# Patient Record
Sex: Male | Born: 1975 | Hispanic: Yes | Marital: Married | Smoking: Never smoker
Health system: Southern US, Community
[De-identification: ages and names within clinical notes are randomized; demographics above are authoritative.]

---

## 2012-02-05 ENCOUNTER — Telehealth: Payer: Self-pay | Admitting: Family Medicine

## 2012-02-05 NOTE — Telephone Encounter (Signed)
Pt's wife, Evan Chandler is your pt and they want you to be the pt's PCP as well.  The pt had some blood work completed through his employer and the results came back with some abnormalities (tryclycerides 892, plus others). [facility] ran the test and they strongly suggested he get in to see his dr immediately.  The first new pt apptmt you have available is not until March 17th. Can you accommodate him a new pt apptmt soon? Thank you.

## 2012-02-05 NOTE — Telephone Encounter (Signed)
Yes ok to accommodate sooner.

## 2012-02-06 ENCOUNTER — Ambulatory Visit (INDEPENDENT_AMBULATORY_CARE_PROVIDER_SITE_OTHER): Payer: 59 | Admitting: Family Medicine

## 2012-02-06 ENCOUNTER — Encounter: Payer: Self-pay | Admitting: Family Medicine

## 2012-02-06 VITALS — BP 122/82 | HR 71 | Temp 97.9°F | Ht 73.0 in | Wt 261.5 lb

## 2012-02-06 DIAGNOSIS — E782 Mixed hyperlipidemia: Secondary | ICD-10-CM

## 2012-02-06 DIAGNOSIS — R7989 Other specified abnormal findings of blood chemistry: Secondary | ICD-10-CM

## 2012-02-06 LAB — HEPATIC FUNCTION PANEL
Alkaline Phosphatase: 50 U/L (ref 39–117)
Bilirubin, Direct: 0.1 mg/dL (ref 0.0–0.3)
Total Bilirubin: 1 mg/dL (ref 0.3–1.2)

## 2012-02-06 LAB — LIPID PANEL
HDL: 30.8 mg/dL — ABNORMAL LOW (ref >39.00)
Total CHOL/HDL Ratio: 6
VLDL: 48.8 mg/dL — ABNORMAL HIGH (ref 0.0–40.0)

## 2012-02-06 LAB — GAMMA GT: GGT: 18 U/L (ref 7–51)

## 2012-02-06 NOTE — Progress Notes (Signed)
  Subjective:    Patient ID: Evan Chandler, male    DOB: Jul 09, 1975, 37 y.o.   MRN: [id number]  HPI  37 yo previously healthy male here to establish care emergently with his wife (she is my patient) to discuss abnormal labs.  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  Brings his labs in with him today:  AST 246 ALT 101 TG 892  All other labs within normal limits- CBC, BMET.  Per pt, he had a h/o elevated TG in 400s 5 or 6 years ago but last year his TG were 135.  He has never had abnormal liver function tests.  Denies any abdominal pain, nausea or vomiting. No changes in his bowel habits.  Repots drinking a few beers a few times per month. Takes Excedrin migraine maybe twice a month- no other NSAIDs or Acetaminophen containing substance.  Upon further questioning, he does admit to taking supplements including- Branched chained amino acids, C4 , "Jacked 3D."  But he does not admit to using steroids.  Patient Active Problem List  Diagnosis  . Elevated cholesterol with high triglycerides  . Elevated liver function tests   No past medical history on file. No past surgical history on file. History  Substance Use Topics  . Smoking status: Never Smoker   . Smokeless tobacco: Not on file  . Alcohol Use: Yes   Family History  Problem Relation Age of Onset  . Diabetes Mother   . Diabetes Father    No Known Allergies No current outpatient prescriptions on file prior to visit.   The PMH, PSH, Social History, Family History, Medications, and allergies have been reviewed in [facility], and have been updated if relevant.   Review of Systems See HPI Patient reports no  vision/ hearing changes,anorexia, weight change, fever ,adenopathy, persistant / recurrent hoarseness, swallowing issues, chest pain, edema,persistant / recurrent cough, hemoptysis, dyspnea(rest, exertional, paroxysmal nocturnal), gastrointestinal  bleeding (melena, rectal bleeding),  abdominal pain, excessive heart burn, GU symptoms(dysuria, hematuria, pyuria, voiding/incontinence  Issues) syncope, focal weakness, severe memory loss, concerning skin lesions, depression, anxiety, abnormal bruising/bleeding, major joint swelling.       Objective:   Physical Exam BP 122/82  Pulse 71  Temp 97.9 F (36.6 C) (Oral)  Ht 6\' 1"  (1.854 m)  Wt 261 lb 8 oz (118.616 kg)  BMI 34.50 kg/m2  SpO2 97% Gen:  Very muscular male, NAD Resp:  CTA bilaterally No gynceomastia CVS:  RRR Abd:  Sofr, NT, no hepatosplenomegaly Ext:  No edema    Assessment & Plan:   1. Elevated cholesterol with high triglycerides  New- Will absolutely need to start triglyceride reducing medication like Tricor but I would like to confirm that this was not lab error.  Also discussed ways to lower triglycerides- see pt instructions for details.  I suspect his supplements are playing a roll as well. Lipid Panel  2. Elevated liver function tests  New- I suspect this is due to his supplement use, most of which appear to be metabolized in liver.  I am also concerned about possibility of steroid use.  Will check hepatic function panel, GTT and call pt tomorrow with labs results. The patient indicates understanding of these issues and agrees with the plan.  Gamma GT, Hepatic function panel

## 2012-02-06 NOTE — Patient Instructions (Addendum)
Nice to meet you.  Let's recheck your labs today.  Most likely we will need to start a medication to lower your triglycerides (if these labs were true). Please STOP taking all supplements, alcohol and excedrin for now.  Weight loss (even small amount) can decrease triglycerides.  Decrease added sugars, eliminate trans fats, increase fiber and limit alcohol.  All these changes together can drop triglycerides by almost 50%.  We will call you tomorrow.

## 2012-02-07 ENCOUNTER — Telehealth: Payer: Self-pay | Admitting: Family Medicine

## 2012-02-07 NOTE — Telephone Encounter (Signed)
Patient has been advised

## 2012-02-07 NOTE — Telephone Encounter (Signed)
Evan Chandler, Please call the patient with his most recent lab results.  He is anxious to know the results.  We had the wrong number in Epic but I have corrected it in his demographics and listed the correct phone # below.   [phone]

## 2012-04-06 ENCOUNTER — Ambulatory Visit: Payer: Self-pay | Admitting: Family Medicine

## 2013-10-05 ENCOUNTER — Ambulatory Visit (INDEPENDENT_AMBULATORY_CARE_PROVIDER_SITE_OTHER): Payer: 59 | Admitting: Family Medicine

## 2013-10-05 ENCOUNTER — Encounter: Payer: Self-pay | Admitting: Family Medicine

## 2013-10-05 VITALS — BP 126/70 | HR 75 | Temp 98.3°F | Wt 263.5 lb

## 2013-10-05 DIAGNOSIS — R109 Unspecified abdominal pain: Secondary | ICD-10-CM

## 2013-10-05 DIAGNOSIS — R1032 Left lower quadrant pain: Secondary | ICD-10-CM | POA: Insufficient documentation

## 2013-10-05 LAB — POCT URINALYSIS DIPSTICK
Bilirubin, UA: NEGATIVE
Glucose, UA: NEGATIVE
Ketones, UA: NEGATIVE
Leukocytes, UA: NEGATIVE
NITRITE UA: NEGATIVE
PH UA: 6
PROTEIN UA: NEGATIVE
RBC UA: NEGATIVE
Spec Grav, UA: >=1.03
UROBILINOGEN UA: 0.2

## 2013-10-05 NOTE — Progress Notes (Signed)
   Subjective:   Patient ID: Evan Chandler, male    DOB: Feb 22, 1975, 38 y.o.   MRN: [id number]  Evan Chandler is a pleasant 38 y.o. year old male who presents to clinic today with Groin Pain  on 10/05/2013  HPI:  Left inguinal pain- Over a year of left inguinal pain (last seconds) when he coughs.  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  No pain with bowel movements. No nausea, vomiting or fevers.  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  Has not felt any testicular or scrotal masses.  Remote h/o bilateral inguinal hernia repair as an infant.  No current outpatient prescriptions on file prior to visit.   No current facility-administered medications on file prior to visit.    No Known Allergies  No past medical history on file.  No past surgical history on file.  Family History  Problem Relation Age of Onset  . Diabetes Mother   . Diabetes Father     History   Social History  . Marital Status: Married    Spouse Name: N/A    Number of Children: N/A  . Years of Education: N/A   Occupational History  . firefighter Unemployed   Social History Main Topics  . Smoking status: Never Smoker   . Smokeless tobacco: Not on file  . Alcohol Use: Yes  . Drug Use: No  . Sexual Activity: Not on file   Other Topics Concern  . Not on file   Social History Narrative  . No narrative on file   The PMH, PSH, Social History, Family History, Medications, and allergies have been reviewed in [facility], and have been updated if relevant.  Review of Systems See HPI No dysuria No difficulty urinating No sexual difficulty    Objective:    BP 126/70  Pulse 75  Temp(Src) 98.3 F (36.8 C) (Oral)  Wt 263 lb 8 oz (119.523 kg)  SpO2 98%   Physical Exam  Nursing note and vitals reviewed. Constitutional: He appears well-developed and well-nourished. No distress.  Abdominal: A hernia is present. Hernia  confirmed positive in the left inguinal area.  Genitourinary: Cremasteric reflex is present.  Lymphadenopathy:       Left: No inguinal adenopathy present.  Psychiatric: He has a normal mood and affect. His speech is normal and behavior is normal. Judgment and thought content normal. Cognition and memory are normal.          Assessment & Plan:   Left inguinal pain - Plan: US Scrotum  Flank pain - Plan: Urinalysis Dipstick No Follow-up on file.

## 2013-10-05 NOTE — Addendum Note (Signed)
Addended by: Dianne Dun on: 10/05/2013 12:08 PM   Modules accepted: Orders

## 2013-10-05 NOTE — Patient Instructions (Signed)
Good to see you. Please stop by to see Evan Chandler on your way out to set up your ultrasound. We will call you with these results as soon as we have them.

## 2013-10-05 NOTE — Assessment & Plan Note (Signed)
New- persistent. Consistent with inguinal hernia. Will get scrotal/inguinal [place] today. Likely will need referral to surgeon. The patient indicates understanding of these issues and agrees with the plan.

## 2013-10-05 NOTE — Progress Notes (Signed)
Pre visit review using our clinic review tool, if applicable. No additional management support is needed unless otherwise documented below in the visit note. 

## 2013-10-06 ENCOUNTER — Ambulatory Visit
Admission: RE | Admit: 2013-10-06 | Discharge: 2013-10-06 | Disposition: A | Discharge status: Home / Self Care | Payer: 59 | Source: Ambulatory Visit | Attending: Family Medicine | Admitting: Family Medicine

## 2013-10-06 ENCOUNTER — Telehealth: Payer: Self-pay

## 2013-10-06 DIAGNOSIS — R1032 Left lower quadrant pain: Secondary | ICD-10-CM

## 2013-10-06 NOTE — Telephone Encounter (Signed)
Pt left v/m requesting cb when [place] results available.

## 2013-10-07 ENCOUNTER — Other Ambulatory Visit: Payer: Self-pay | Admitting: Family Medicine

## 2013-10-07 DIAGNOSIS — R1032 Left lower quadrant pain: Secondary | ICD-10-CM

## 2013-10-07 NOTE — Telephone Encounter (Signed)
See phone note

## 2013-10-08 ENCOUNTER — Other Ambulatory Visit: Payer: Self-pay | Admitting: Family Medicine

## 2013-10-21 ENCOUNTER — Telehealth: Payer: Self-pay

## 2013-10-21 NOTE — Telephone Encounter (Signed)
Pt left v/m; [facility] but no message was left and pt request cb with reason for call.

## 2013-10-22 NOTE — Telephone Encounter (Signed)
Shirlee LimerickMarion tried to contact pt and will call pt back.

## 2013-10-22 NOTE — Telephone Encounter (Signed)
i did not contact this pt

## 2013-12-29 ENCOUNTER — Ambulatory Visit (INDEPENDENT_AMBULATORY_CARE_PROVIDER_SITE_OTHER): Payer: 59 | Admitting: Family Medicine

## 2013-12-29 ENCOUNTER — Encounter: Payer: Self-pay | Admitting: Family Medicine

## 2013-12-29 VITALS — BP 120/84 | HR 71 | Temp 98.3°F | Wt 252.0 lb

## 2013-12-29 DIAGNOSIS — R1032 Left lower quadrant pain: Secondary | ICD-10-CM

## 2013-12-29 DIAGNOSIS — R1030 Lower abdominal pain, unspecified: Secondary | ICD-10-CM

## 2013-12-29 DIAGNOSIS — E782 Mixed hyperlipidemia: Secondary | ICD-10-CM

## 2013-12-29 LAB — COMPREHENSIVE METABOLIC PANEL
ALK PHOS: 49 U/L (ref 39–117)
ALT: 28 U/L (ref 0–53)
AST: 25 U/L (ref 0–37)
Albumin: 4.6 g/dL (ref 3.5–5.2)
BILIRUBIN TOTAL: 1.3 mg/dL — AB (ref 0.2–1.2)
BUN: 19 mg/dL (ref 6–23)
CO2: 25 mEq/L (ref 19–32)
Calcium: 9.1 mg/dL (ref 8.4–10.5)
Chloride: 101 mEq/L (ref 96–112)
Creatinine, Ser: 1 mg/dL (ref 0.4–1.5)
GFR: 92.46 mL/min (ref >60.00)
GLUCOSE: 87 mg/dL (ref 70–99)
Potassium: 4.4 mEq/L (ref 3.5–5.1)
SODIUM: 135 meq/L (ref 135–145)
Total Protein: 7.4 g/dL (ref 6.0–8.3)

## 2013-12-29 LAB — LIPID PANEL
Cholesterol: 183 mg/dL (ref 0–200)
HDL: 36.7 mg/dL — ABNORMAL LOW (ref >39.00)
LDL CALC: 122 mg/dL — AB (ref 0–99)
NONHDL: 146.3
Total CHOL/HDL Ratio: 5
Triglycerides: 122 mg/dL (ref 0.0–149.0)
VLDL: 24.4 mg/dL (ref 0.0–40.0)

## 2013-12-29 LAB — PSA: PSA: 0.33 ng/mL (ref 0.10–4.00)

## 2013-12-29 NOTE — Assessment & Plan Note (Signed)
Overdue for labs. Will check labs today Also discussed continuing to work on his diet which he has been doing- has lost 11 pounds since Sept 2015!

## 2013-12-29 NOTE — Patient Instructions (Signed)
Great to see you and happy holidays. We will call you with your lab results and your urology appointment.

## 2013-12-29 NOTE — Progress Notes (Signed)
Subjective:   Patient ID: Evan Chandler, male    DOB: Mar 07, 1975, 38 y.o.   MRN: [id number]  Evan Girtdrian Mcqueen is a pleasant 38 y.o. year old male who presents to clinic today with Follow-up and Groin Pain  on 12/29/2013  HPI:  Left inguinal pain- initially saw him for this in 09/2013.     Over a year of left inguinal pain (last seconds) when he coughs.  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  No pain with bowel movements. No nausea, vomiting or fevers.  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  Has not felt any testicular or scrotal masses.  Remote h/o bilateral inguinal hernia repair as an infant.  US of left inguinal region neg.  He is a IT sales professionalfirefighter and this pain has not changed but he is concerned that it continues to occur intermittently. Also concerned about possible cancer from exposure to work related carcinogens.  H/p hypertriglyceridemia- overdue for labs.  He is still taking omega 3 Lab Results  Component Value Date   CHOL 198 02/06/2012   HDL 30.80* 02/06/2012   LDLDIRECT 100.2 02/06/2012   TRIG 244.0* 02/06/2012   CHOLHDL 6 02/06/2012    No current outpatient prescriptions on file prior to visit.   No current facility-administered medications on file prior to visit.    No Known Allergies  No past medical history on file.  No past surgical history on file.  Family History  Problem Relation Age of Onset  . Diabetes Mother   . Diabetes Father     History   Social History  . Marital Status: Married    Spouse Name: N/A    Number of Children: N/A  . Years of Education: N/A   Occupational History  . firefighter Unemployed   Social History Main Topics  . Smoking status: Never Smoker   . Smokeless tobacco: Not on file  . Alcohol Use: 0.0 oz/week    0 Not specified per week     Comment: occasional  . Drug Use: No  . Sexual Activity: Not on file   Other Topics Concern    . Not on file   Social History Narrative   The PMH, PSH, Social History, Family History, Medications, and allergies [facility], and have been updated if relevant.  Review of Systems See HPI No dysuria No difficulty urinating No sexual difficulty No nausea or vomiting No back pain No LE edema No radiculopathy No abdominal pain    Objective:    BP 120/84 mmHg  Pulse 71  Temp(Src) 98.3 F (36.8 C) (Oral)  Wt 252 lb (114.306 kg)  SpO2 98%  Wt Readings from Last 3 Encounters:  12/29/13 252 lb (114.306 kg)  10/05/13 263 lb 8 oz (119.523 kg)  02/06/12 261 lb 8 oz (118.616 kg)     Physical Exam  Constitutional: He is oriented to person, place, and time. He appears well-developed and well-nourished. No distress.  HENT:  Head: Normocephalic.  Cardiovascular: Normal rate.   Pulmonary/Chest: Effort normal.  Abdominal: Soft. Bowel sounds are normal. He exhibits no distension and no mass. There is no tenderness. There is no rebound and no guarding. Hernia confirmed negative in the right inguinal area and confirmed negative in the left inguinal area.  Genitourinary: Testes normal and penis normal. Cremasteric reflex is present. Circumcised.  Musculoskeletal: He exhibits no edema.  Lymphadenopathy:  Right: No inguinal adenopathy present.       Left: No inguinal adenopathy present.  Neurological: He is alert and oriented to person, place, and time.  Skin: Skin is warm and dry.  Psychiatric: He has a normal mood and affect. His behavior is normal.          Assessment & Plan:   Elevated cholesterol with high triglycerides - Plan: Lipid panel, Comprehensive metabolic panel  Left groin pain - Plan: Ambulatory referral to Urology, PSA No Follow-up on file.

## 2013-12-29 NOTE — Progress Notes (Signed)
Pre visit review using our clinic review tool, if applicable. No additional management support is needed unless otherwise documented below in the visit note. 

## 2013-12-29 NOTE — Assessment & Plan Note (Signed)
Persistent. Agrees now to urology referral. Exam reassuring again- ?overuse injury like sports hernia but evaluation from urology would be prudent given duration of symptoms. The patient indicates understanding of these issues and agrees with the plan. Will also check labs given his work exposure to carcinogens.   ?scrotal ultrasound as well- will refer to urology.

## 2013-12-30 ENCOUNTER — Encounter: Payer: Self-pay | Admitting: *Deleted

## 2014-01-17 ENCOUNTER — Other Ambulatory Visit: Payer: Self-pay | Admitting: Occupational Medicine

## 2014-01-17 ENCOUNTER — Ambulatory Visit
Admission: RE | Admit: 2014-01-17 | Discharge: 2014-01-17 | Disposition: A | Discharge status: Home / Self Care | Payer: No Typology Code available for payment source | Source: Ambulatory Visit | Attending: Occupational Medicine | Admitting: Occupational Medicine

## 2014-01-17 DIAGNOSIS — Z0000 Encounter for general adult medical examination without abnormal findings: Secondary | ICD-10-CM

## 2014-01-28 ENCOUNTER — Ambulatory Visit (INDEPENDENT_AMBULATORY_CARE_PROVIDER_SITE_OTHER): Payer: Self-pay | Admitting: Surgery

## 2014-01-28 NOTE — H&P (Signed)
Evan Chandler 01/28/2014 9:20 AM Location: [facility] Patient #: [id number] DOB: December 25, 1975 Married / Language: [place] / Race: White Male History of Present Illness Evan Chandler(Evan Augspurger A. Nazaire Cordial MD; 01/28/2014 10:45 AM) [facility] words: [facility]   pt sent at the request of Dr Brunilda PayorNesi for left inguinal hernia. He has had a bulge in his left groin for 5 years he thinks. It causes discomfort if he stands for more than 30 minutes. 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 He strains alot and this does not cause him any groin pain. The discomfort with standing is minimal. No bulge on the right side. The discomfort is described as burning. It is made better when he sits.  The patient is a 39 year old male   Other Problems Evan Chandler(Evan Chandler, CMA; 01/28/2014 9:20 AM) Inguinal Hernia  Diagnostic Studies History Evan Chandler(Evan Chandler, CMA; 01/28/2014 9:20 AM) Colonoscopy never  Allergies (Evan Chandler, CMA; 01/28/2014 9:22 AM) No Known Drug Allergies 01/28/2014  Medication History (Evan Chandler, CMA; 01/28/2014 9:22 AM) No Current Medications  Social History Evan Chandler(Evan Chandler, CMA; 01/28/2014 9:20 AM) Caffeine use Carbonated beverages.  Family History Evan Chandler(Evan Chandler, CMA; 01/28/2014 9:20 AM) Arthritis Mother.     Review of Systems Evan Chandler(Evan Chandler CMA; 01/28/2014 9:20 AM) General Not Present- Appetite Loss, Chills, Fatigue, Fever, Night Sweats, Weight Gain and Weight Loss. Skin Not Present- Change in Wart/Mole, Dryness, Hives, Jaundice, New Lesions, Non-Healing Wounds, Rash and Ulcer. Male Genitourinary Not Present- Blood in Urine, Change in Urinary Stream, Frequency, Impotence, Nocturia, Painful Urination, Urgency and Urine Leakage.  Vitals (Evan Chandler CMA; 01/28/2014 9:22 AM) 01/28/2014 9:20 AM Weight: 258 lb Height: 73in Body Surface Area: 2.46 m Body Mass Index: 34.04 kg/m Temp.: 98.56F(Temporal)  Pulse: 70 (Regular)  Resp.: 18 (Unlabored)  BP: 130/76 (Sitting, Left Arm,  Standard)     Physical Exam (Evan Demarinis A. Jaeliana Lococo MD; 01/28/2014 10:46 AM)  General Mental Status-Alert. General Appearance-Consistent with stated age. Hydration-Well hydrated. Voice-Normal.  Head and Neck Head-normocephalic, atraumatic with no lesions or palpable masses. Trachea-midline.  Eye Eyeball - Bilateral-Extraocular movements intact. Sclera/Conjunctiva - Bilateral-No scleral icterus.  Chest and Lung Exam Chest and lung exam reveals -quiet, even and easy respiratory effort with no use of accessory muscles and on auscultation, normal breath sounds, no adventitious sounds and normal vocal resonance. Inspection Chest Wall - Normal. Back - normal.  Cardiovascular Cardiovascular examination reveals -normal heart sounds, regular rate and rhythm with no murmurs and normal pedal pulses bilaterally.  Abdomen Inspection Skin - Scar - no surgical scars. Hernias - Ventral - Reducible. Inguinal hernia - Left - Reducible. Note: scar noted. Right - Note: scar noted no hernia. Palpation/Percussion Palpation and Percussion of the abdomen reveal - Soft, Non Tender, No Rebound tenderness, No Rigidity (guarding) and No hepatosplenomegaly. Auscultation Auscultation of the abdomen reveals - Bowel sounds normal.  Neurologic Neurologic evaluation reveals -alert and oriented x 3 with no impairment of recent or remote memory. Mental Status-Normal.  Musculoskeletal Normal Exam - Left-Upper Extremity Strength Normal and Lower Extremity Strength Normal. Normal Exam - Right-Upper Extremity Strength Normal, Lower Extremity Weakness.    Assessment & Plan (Evan Torregrossa A. Henri Baumler MD; 01/28/2014 10:47 AM)  LEFT INGUINAL HERNIA (550.90  K40.90) Impression: PT DESIRES REPAIR BUT IS [place]AV[place] 12 DAYS. NOT SURE WE CAN SCHEDULE THAT FAST BUT WILL LOOK INTO IT. HE IS HAVING MINIMAL PAIN BUT WANTS TO TRY TO REPAIR BEFORE LEAVING THE COUNTRY. GAVE SURGICAL AND NON  SURGICAL OPTIONS.  RISK OF INCARCERATION DISCUSSED AND WHAT TO DO IF THAT HAPPENS. HE IS HAVING MINIMAL PAIN AND OBSERVATION AN OPTION AS WELL. WILL TRY TO SET UP LIH REPAIR WITH MESH IF POSSIBLE. . The risk of hernia repair include bleeding, infection, organ injury, bowel injury, bladder injury, nerve injury recurrent hernia, blood clots, worsening of underlying condition, chronic pain, mesh use, open surgery, death, and the need for other operattions. Pt agrees to proceed  Current Plans Pt Education - [facility] Umbilical/ Inguinal Hernia [facility]

## 2015-08-20 IMAGING — CR DG CHEST 2V
2 series · 2 of 2 positions shown · non-contrast
Comparison: 02/05/2007 .

CLINICAL DATA: Routine physical.

EXAM:
CHEST  2 VIEW

[w chest pa]
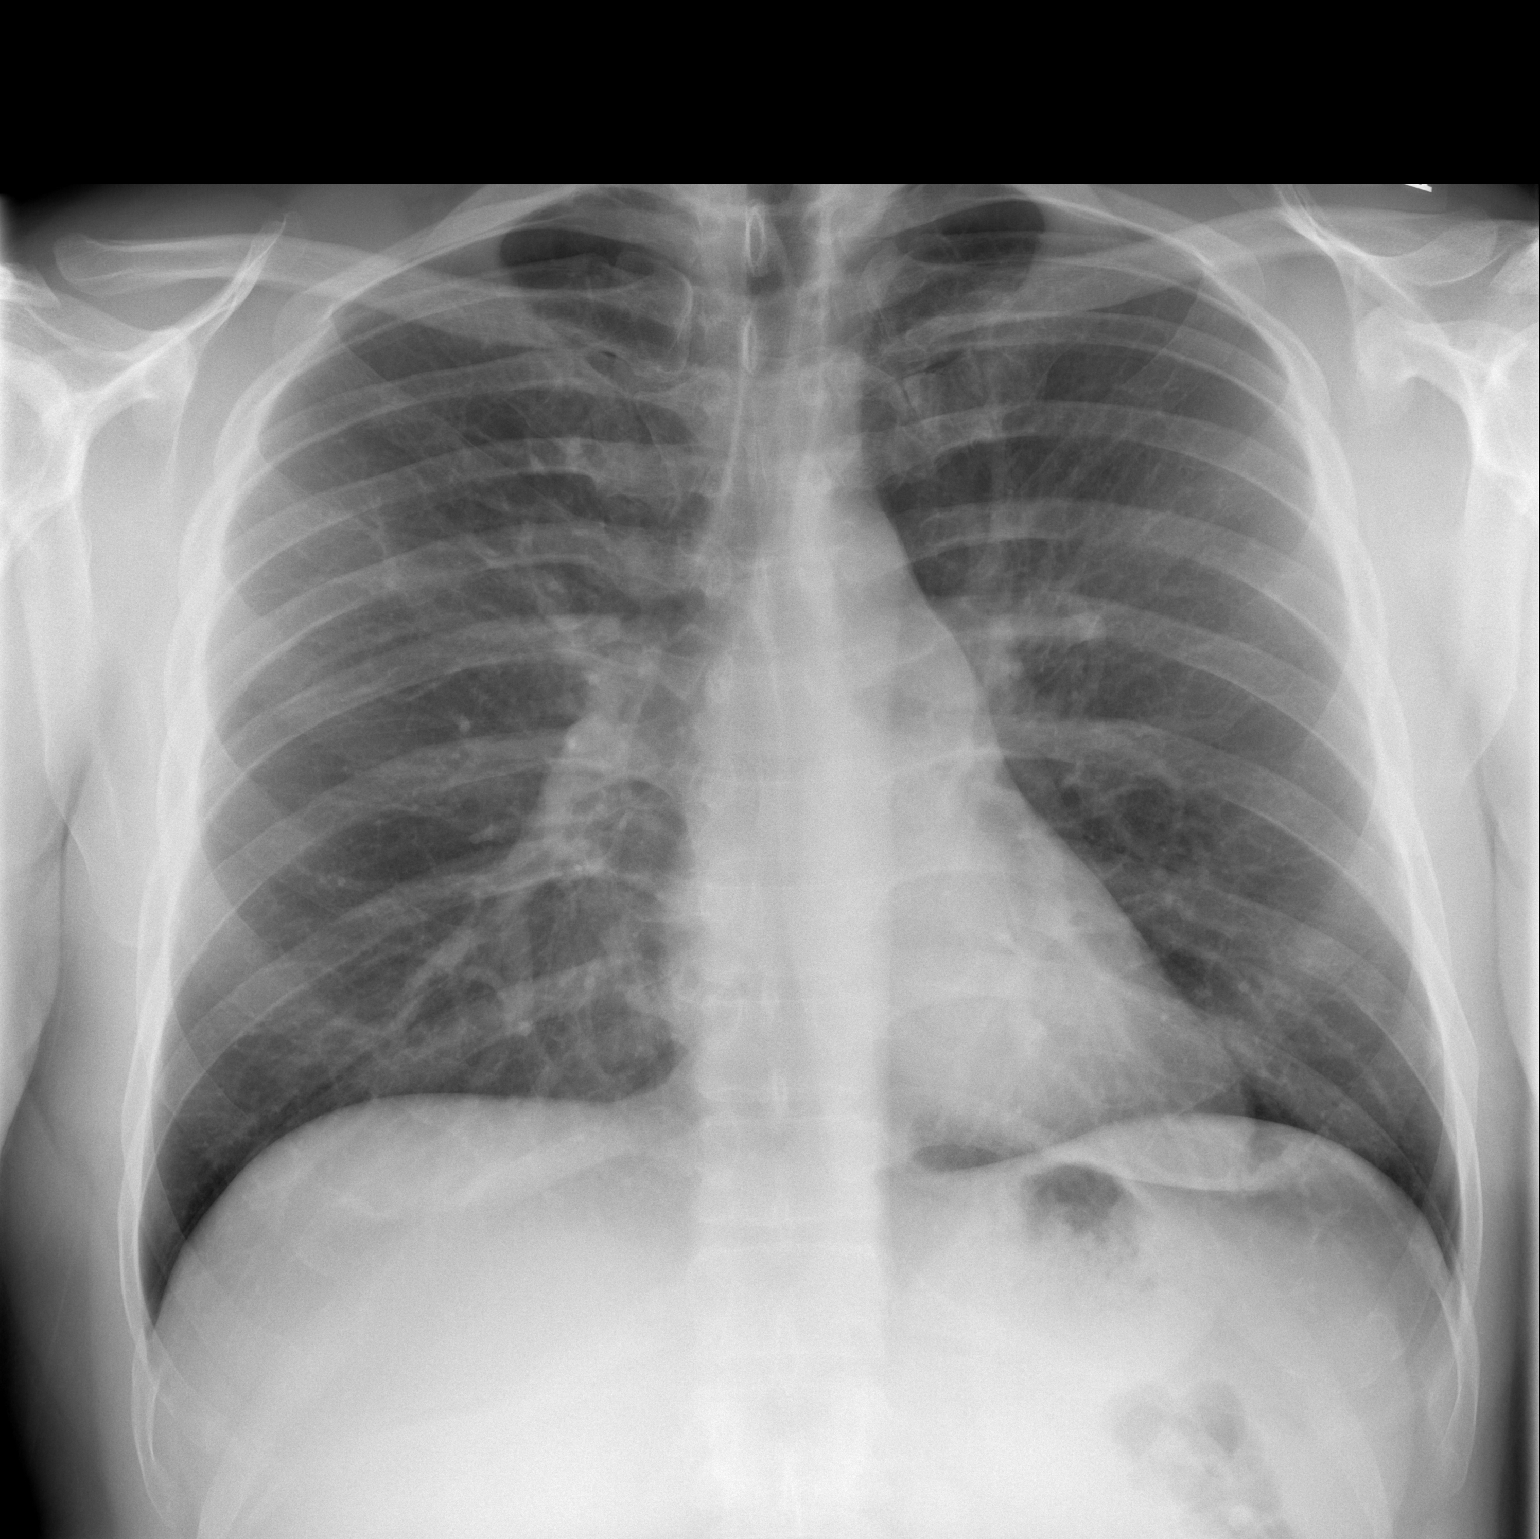

[w chest lat]
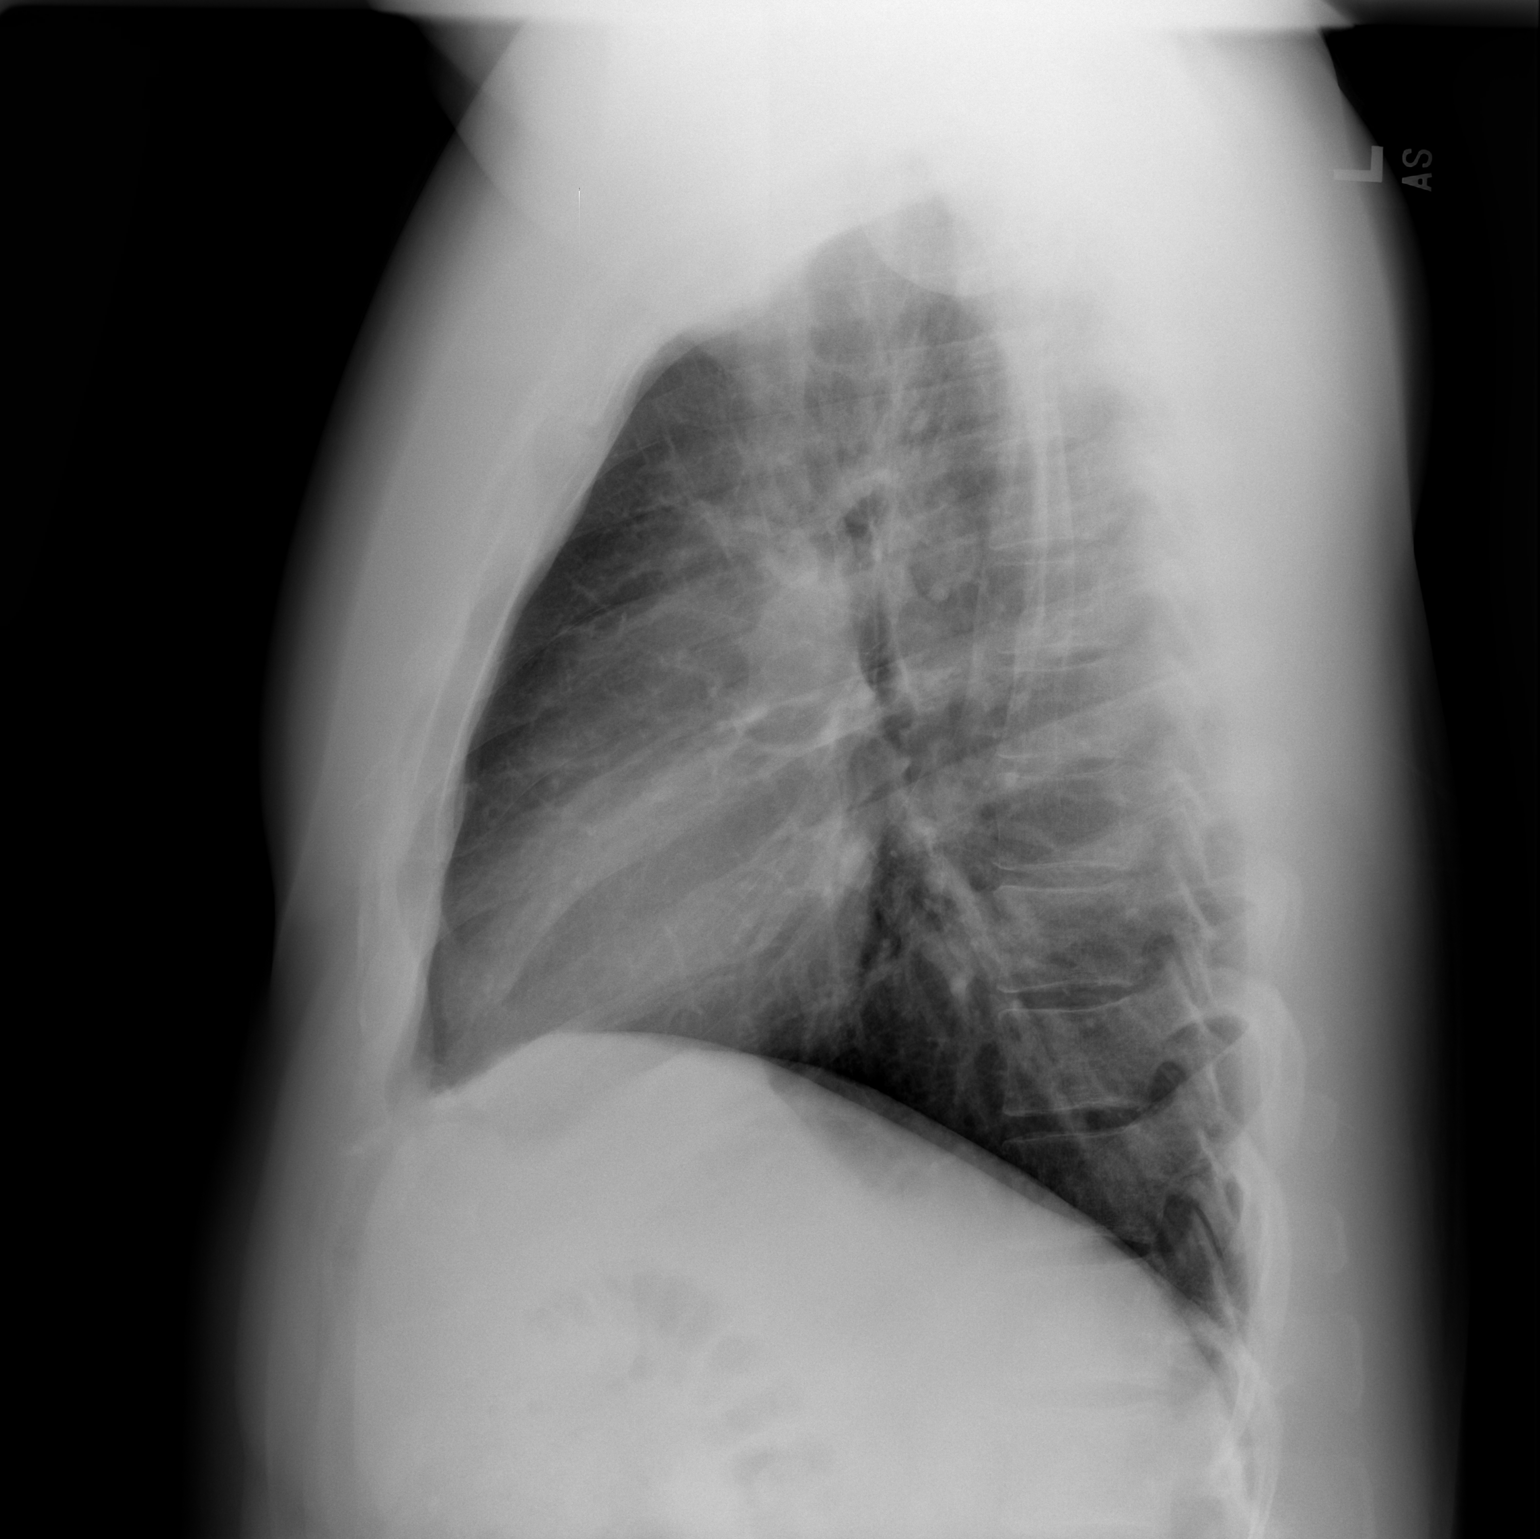

[2 of 2 positions shown; findings below may reference images not displayed]

FINDINGS: Mediastinum and hilar structures are normal. Stable linear density
right upper lobe consistent with scar. Lungs are clear of acute
infiltrates. No pleural effusion or pneumothorax heart size normal.
No acute bony abnormality P
IMPRESSION: 1. Tiny right upper lobe pulmonary scar, no change from 02/05/2007 .
2. No acute cardiopulmonary disease.
